# Patient Record
Sex: Male | Born: 1980 | Race: Black or African American | Hispanic: No | Marital: Single | Smoking: Current every day smoker
Health system: Southern US, Community
[De-identification: ages and names within clinical notes are randomized; demographics above are authoritative.]

## PROBLEM LIST (undated history)

## (undated) DIAGNOSIS — F259 Schizoaffective disorder, unspecified: Secondary | ICD-10-CM

## (undated) DIAGNOSIS — I10 Essential (primary) hypertension: Secondary | ICD-10-CM

---

## 2017-03-04 ENCOUNTER — Encounter (HOSPITAL_COMMUNITY): Payer: Self-pay

## 2017-03-04 ENCOUNTER — Other Ambulatory Visit: Payer: Self-pay

## 2017-03-04 ENCOUNTER — Emergency Department (HOSPITAL_COMMUNITY)
Admission: EM | Admit: 2017-03-04 | Discharge: 2017-03-04 | Disposition: A | Discharge status: Home / Self Care | Payer: Self-pay | Attending: Emergency Medicine | Admitting: Emergency Medicine

## 2017-03-04 ENCOUNTER — Emergency Department (HOSPITAL_COMMUNITY): Payer: Self-pay

## 2017-03-04 DIAGNOSIS — F1210 Cannabis abuse, uncomplicated: Secondary | ICD-10-CM | POA: Insufficient documentation

## 2017-03-04 DIAGNOSIS — I10 Essential (primary) hypertension: Secondary | ICD-10-CM | POA: Insufficient documentation

## 2017-03-04 DIAGNOSIS — F17210 Nicotine dependence, cigarettes, uncomplicated: Secondary | ICD-10-CM | POA: Insufficient documentation

## 2017-03-04 DIAGNOSIS — F1290 Cannabis use, unspecified, uncomplicated: Secondary | ICD-10-CM

## 2017-03-04 DIAGNOSIS — R002 Palpitations: Secondary | ICD-10-CM | POA: Insufficient documentation

## 2017-03-04 DIAGNOSIS — R1110 Vomiting, unspecified: Secondary | ICD-10-CM | POA: Insufficient documentation

## 2017-03-04 DIAGNOSIS — R0789 Other chest pain: Secondary | ICD-10-CM | POA: Insufficient documentation

## 2017-03-04 HISTORY — DX: Essential (primary) hypertension: I10

## 2017-03-04 HISTORY — DX: Schizoaffective disorder, unspecified: F25.9

## 2017-03-04 LAB — BASIC METABOLIC PANEL
Anion gap: 14 (ref 5–15)
BUN: 15 mg/dL (ref 6–20)
CALCIUM: 9.3 mg/dL (ref 8.9–10.3)
CO2: 21 mmol/L — ABNORMAL LOW (ref 22–32)
CREATININE: 1.29 mg/dL — AB (ref 0.61–1.24)
Chloride: 101 mmol/L (ref 101–111)
GFR calc Af Amer: >60 mL/min (ref >60)
Glucose, Bld: 84 mg/dL (ref 65–99)
POTASSIUM: 3.9 mmol/L (ref 3.5–5.1)
SODIUM: 136 mmol/L (ref 135–145)

## 2017-03-04 LAB — I-STAT TROPONIN, ED: TROPONIN I, POC: 0 ng/mL (ref 0.00–0.08)

## 2017-03-04 LAB — CBC
HEMATOCRIT: 39.7 % (ref 39.0–52.0)
Hemoglobin: 13.2 g/dL (ref 13.0–17.0)
MCH: 26.1 pg (ref 26.0–34.0)
MCHC: 33.2 g/dL (ref 30.0–36.0)
MCV: 78.5 fL (ref 78.0–100.0)
PLATELETS: 273 10*3/uL (ref 150–400)
RBC: 5.06 MIL/uL (ref 4.22–5.81)
RDW: 15.5 % (ref 11.5–15.5)
WBC: 10.7 10*3/uL — AB (ref 4.0–10.5)

## 2017-03-04 NOTE — Discharge Instructions (Signed)
You were seen today after having chest pain after smoking marijuana.  Your workup is reassuring.  It is important that you remember that using illicit drugs puts you at risk as drugs can be laced with other substances.

## 2017-03-04 NOTE — ED Provider Notes (Signed)
[facility] EMERGENCY DEPARTMENT Provider Note   CSN: [id number] Arrival date & time: 03/04/17  2130     History   Chief Complaint Chief Complaint  Patient presents with  . Chest Pain    HPI Tom Gibson is a 37 y.o. male.  HPI  This is a 37 year old male with a history of hypertension who presents with chest pain and palpitations.  Patient reports that he "smoked some weed tonight" and had onset of palpitations and anterior nonradiating chest pain.  He states "I think it was laced with something."  Currently he is chest pain-free but states "I feel weird."  He did have 2 episodes of emesis.  He has a history of hypertension and takes clonidine.  Otherwise no significant family history, diabetes, high cholesterol that he knows of.  Denies any shortness of breath, recent fevers or cough.  Denies alcohol or other drug use.  Past Medical History:  Diagnosis Date  . Hypertension   . Schizoaffective disorder (HCC)     There are no active problems to display for this patient.   History reviewed. No pertinent surgical history.     Home Medications    Prior to Admission medications   Not on File    Family History History reviewed. No pertinent family history.  Social History Social History   Tobacco Use  . Smoking status: Current Every Day Smoker    Packs/day: 0.50  . Smokeless tobacco: Former Engineer, water Use Topics  . Alcohol use: Not on file  . Drug use: Yes    Types: Marijuana     Allergies   Patient has no known allergies.   Review of Systems Review of Systems  Constitutional: Negative for fever.  Respiratory: Positive for shortness of breath.   Cardiovascular: Positive for chest pain and palpitations.  Gastrointestinal: Negative for abdominal pain.  Genitourinary: Negative for dysuria.  Neurological: Positive for dizziness.  All other systems reviewed and are negative.    Physical Exam Updated Vital Signs BP (!) 154/90    Pulse 88   Temp 98.1 F (36.7 C) (Oral)   Resp 15   Ht 5\' 6"  (1.676 m)   Wt 72.6 kg (160 lb)   SpO2 100%   BMI 25.82 kg/m   Physical Exam  Constitutional: He is oriented to person, place, and time. He appears well-developed and well-nourished. He does not appear ill.  Overweight  HENT:  Head: Normocephalic and atraumatic.  Eyes: Pupils are equal, round, and reactive to light.  Cardiovascular: Normal rate, regular rhythm, normal heart sounds and normal pulses.  No murmur heard. Tenderness palpation anterior chest wall  Pulmonary/Chest: Effort normal and breath sounds normal. No respiratory distress. He has no wheezes.  Abdominal: Soft. Bowel sounds are normal. There is no tenderness. There is no rebound.  Musculoskeletal: He exhibits no edema.  Lymphadenopathy:    He has no cervical adenopathy.  Neurological: He is alert and oriented to person, place, and time.  Skin: Skin is warm and dry.  Psychiatric: He has a normal mood and affect.  Nursing note and vitals reviewed.    ED Treatments / Results  Labs (all labs ordered are listed, but only abnormal results are displayed) Labs Reviewed  BASIC METABOLIC PANEL - Abnormal; Notable for the following components:      Result Value   CO2 21 (*)    Creatinine, Ser 1.29 (*)    All other components within normal limits  CBC - Abnormal; Notable for  the following components:   WBC 10.7 (*)    All other components within normal limits  I-STAT TROPONIN, ED    EKG  EKG Interpretation None       Radiology Dg Chest 2 View  Result Date: 03/04/2017 CLINICAL DATA:  37 y/o M; smoked marijuana with subsequent severe chest pain. Two episodes of vomiting. EXAM: CHEST  2 VIEW COMPARISON:  None. FINDINGS: The heart size and mediastinal contours are within normal limits. Both lungs are clear. The visualized skeletal structures are unremarkable. IMPRESSION: No acute pulmonary process identified. Electronically Signed   By: Mitzi HansenLance   Furusawa-Stratton M.D.   On: 03/04/2017 04:27    Procedures Procedures (including critical care time)  Medications Ordered in ED Medications - No data to display   Initial Impression / Assessment and Plan / ED Course  I have reviewed the triage vital signs and the nursing notes.  Pertinent labs & imaging results that were available during my care of the patient were reviewed by me and considered in my medical decision making (see chart for details).     She presents with chest pain and palpitations in the setting of smoking marijuana.  He is overall nontoxic appearing.  Chest pain reproducible on exam.  Chest x-ray does not show any evidence of pneumothorax or pneumonia.  EKG is nonischemic.  Troponin is negative.  Doubt ACS.  Given that pain was in the setting of smoking marijuana, may have been related to drug use.  Patient reassured.  After history, exam, and medical workup I feel the patient has been appropriately medically screened and is safe for discharge home. Pertinent diagnoses were discussed with the patient. Patient was given return precautions.   Final Clinical Impressions(s) / ED Diagnoses   Final diagnoses:  Atypical chest pain  Palpitations  Marijuana use    ED Discharge Orders    None       Shon BatonHorton, Kong Packett F, MD 03/04/17 [phone]

## 2017-03-04 NOTE — ED Notes (Signed)
Patient transported to X-ray 

## 2017-03-04 NOTE — ED Triage Notes (Addendum)
Per [facility], pt smoked marijuana went to walk around and experienced 10/10 tight and stabbing left chest and left axillary pain that gets better with pushing on it. Pt then experienced 2 episodes of vomiting. Pt reports still feeling nauseous. Pt had 324 ASA and 0.4 NTG. Pt c/o HA after NTG and refused other doses. Pt initial CP 10/10 and now 4/10. Pt initial BP 202/110 and last BP from EMS 138/90. Pt reports hx of HTN and reports he is taking 0.2 clonidine. Pt denies SOB.

## 2018-09-30 IMAGING — DX DG CHEST 2V
2 series · 2 of 2 positions shown · non-contrast
Comparison: None.

CLINICAL DATA: 36 y/o M; smoked marijuana with subsequent severe
chest pain. Two episodes of vomiting.

EXAM:
CHEST  2 VIEW

[chest pa]
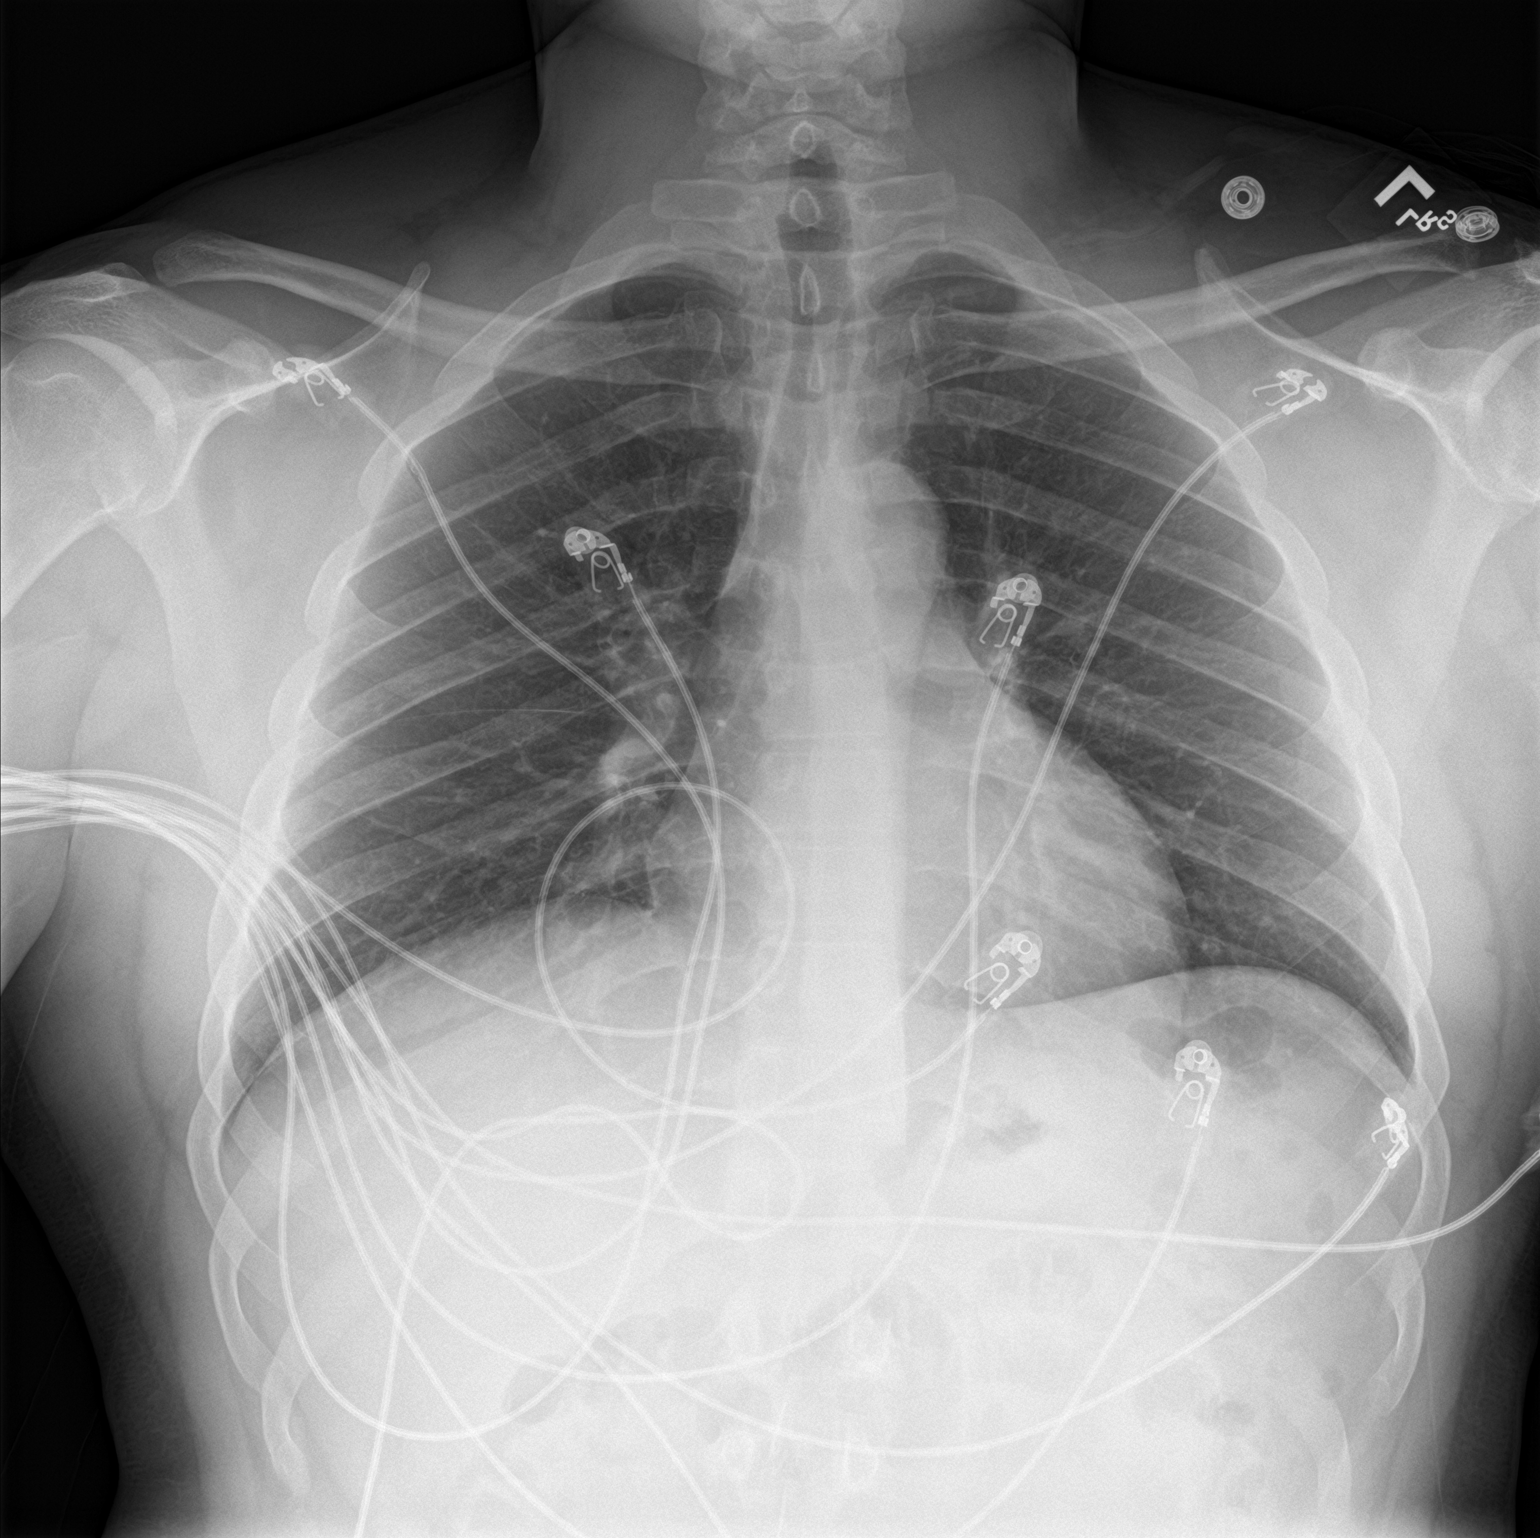

[chest lat]
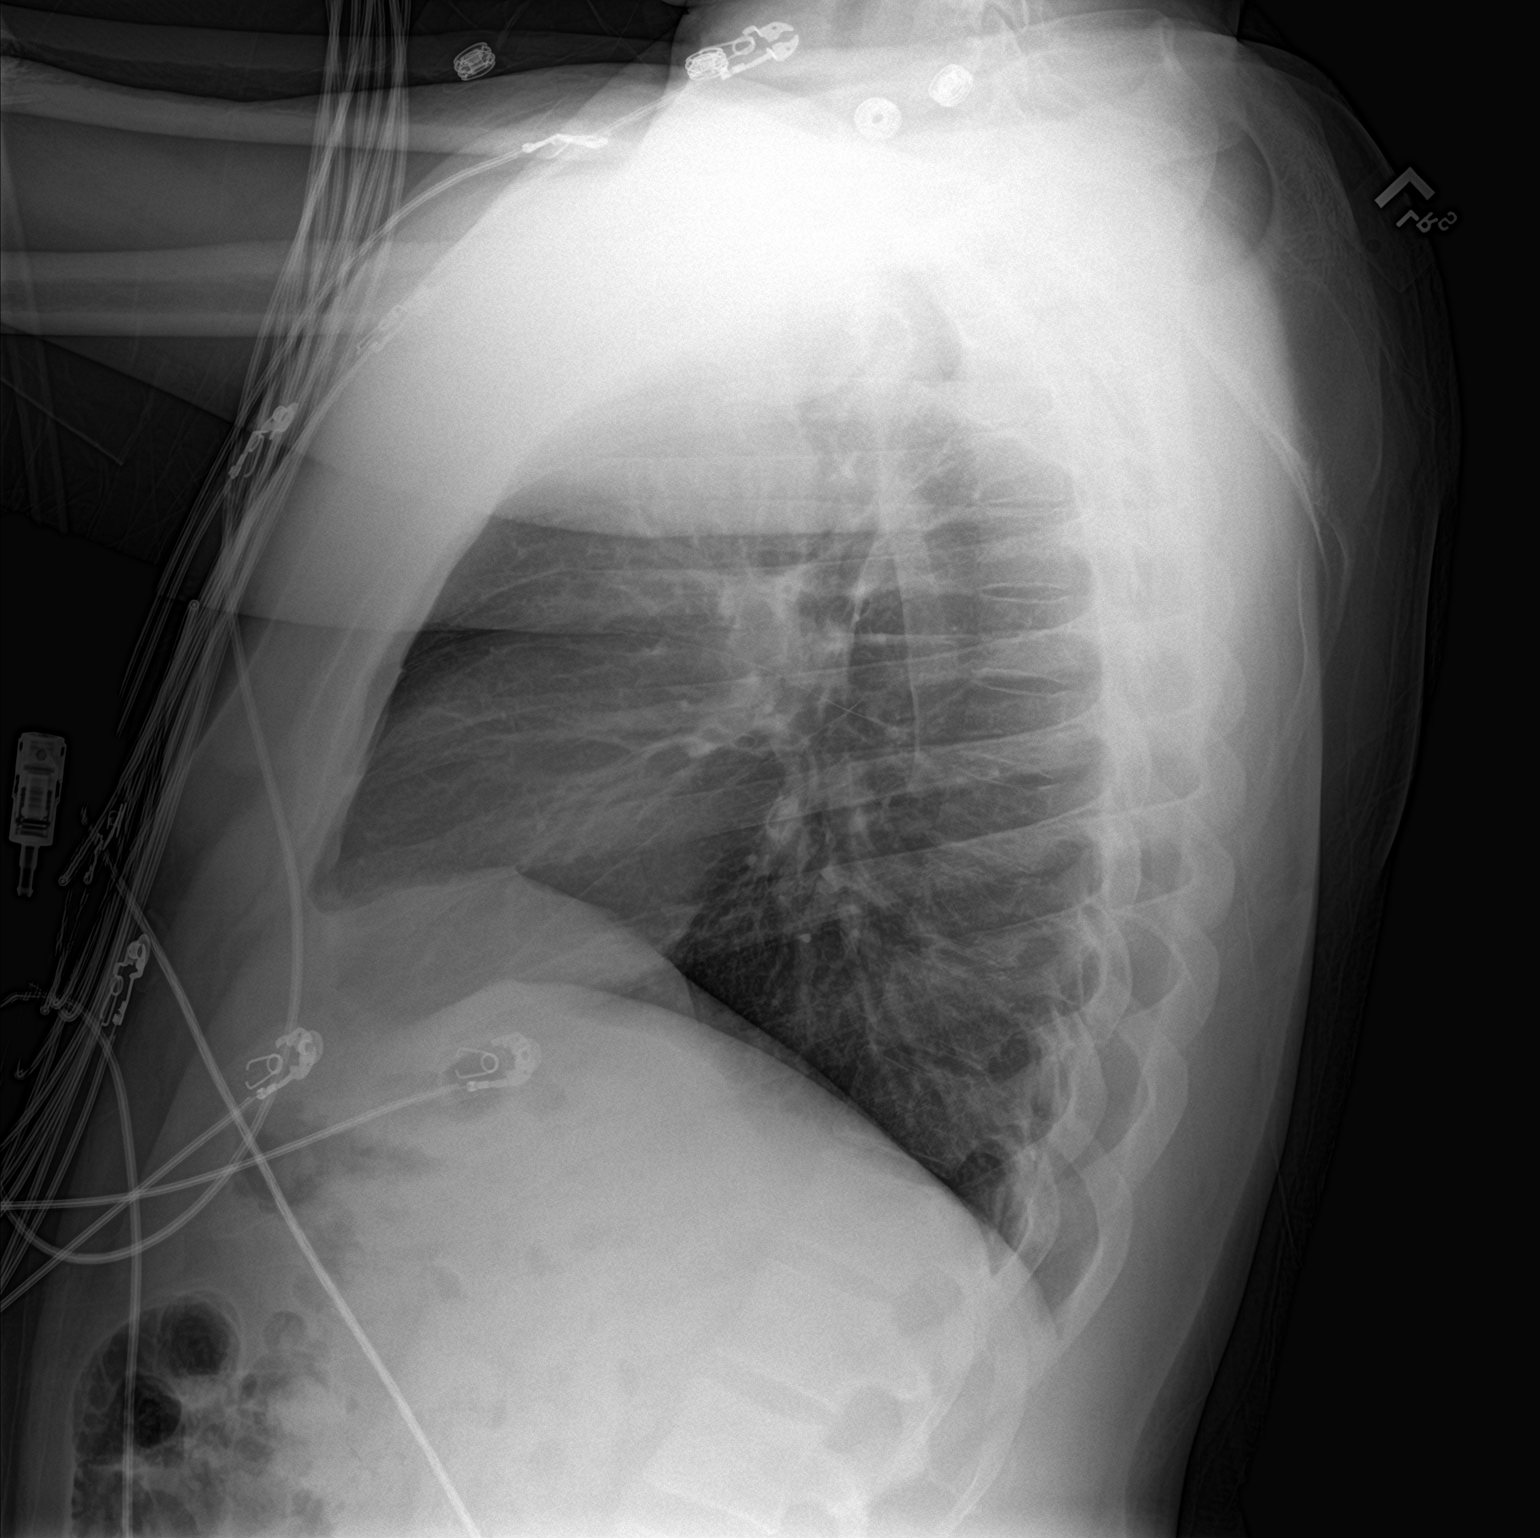

[2 of 2 positions shown; findings below may reference images not displayed]

FINDINGS: The heart size and mediastinal contours are within normal limits.
Both lungs are clear. The visualized skeletal structures are
unremarkable.
IMPRESSION: No acute pulmonary process identified.

By: Metekia Kindeya M.D.
